# Patient Record
Sex: Male | Born: 1995 | Race: White | Hispanic: No | Marital: Single
Health system: Southern US, Community
[De-identification: ages and names within clinical notes are randomized; demographics above are authoritative.]

---
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
## 2021-04-13 ENCOUNTER — Telehealth: Payer: Self-pay | Admitting: *Deleted

## 2021-04-13 ENCOUNTER — Encounter: Payer: Self-pay | Admitting: *Deleted

## 2021-04-13 NOTE — Telephone Encounter (Signed)
Pt's partner advised that pt called out of work today as he had a sore throat, same as partner. Spoke with pt by phone. He reports calling out out of concern for partner having strep throat and didn't want to change being contagious. Reports his sx are mild compared to hers. They are both heading to urgent care now. Will plan to f/u by phone prior to clinic closing today to check on both of them and try to develop a plan for return. For now, will plan on both being out of work tomorrow pending possible strep vs covid testing.

## 2021-04-13 NOTE — Telephone Encounter (Signed)
Reviewed RN Rolly Salter note; reviewed epic no labs/encounter notes visible for today.  Agree with plan of care will follow up with patient tomorrow via telephone.

## 2021-04-14 NOTE — Telephone Encounter (Signed)
Reviewed RN Rolly Salter note agreed with plan of care waiting on partner covid test results patient to isolate until results known.

## 2021-04-14 NOTE — Telephone Encounter (Signed)
Spoke with pt by phone. He is at work currently. He reported his sore throat has improved significantly and thought it was related to being outside a lot this weekend and seasonal allergies since it improved. He was not seen at [facility] yesterday, only took partner there. He is vaxxed and boosted but home is a one bedroom and unable to quarantine separately so RN sent him home today from work at least until partner's results are back, at least through tomorrow 4/20. Pt agreeable with this and left work then.

## 2021-04-16 NOTE — Telephone Encounter (Signed)
Case discussed with RN Rolly Salter in clinic today; reviewed note agreed with plan of care

## 2021-04-16 NOTE — Telephone Encounter (Signed)
Spoke with pt by phone. He reports feeling well. Asymptomatic. Partner's covid test negative but throat culture still pending so cleared pt to RTW tomorrow 4/22 but with mask use until final result is back. Pt verbalizes understanding and agreement. Denies questions/concerns.

## 2021-04-17 NOTE — Telephone Encounter (Signed)
Called pt to confirm RTW today as planned. No answer. LVMRCB.

## 2021-04-17 NOTE — Telephone Encounter (Signed)
Pt's supervisor confirmed he did RTW today as expected. Closing encounter.

## 2021-04-19 NOTE — Telephone Encounter (Signed)
Reviewed RN Rolly Salter note agreed with plan of care; partner covid test negative and RTW.

## 2021-09-15 ENCOUNTER — Ambulatory Visit: Payer: Self-pay | Admitting: *Deleted

## 2021-09-15 ENCOUNTER — Other Ambulatory Visit: Payer: Self-pay

## 2021-09-15 VITALS — BP 112/82 | HR 100 | Ht 76.0 in | Wt 191.0 lb

## 2021-09-15 DIAGNOSIS — Z0000 Encounter for general adult medical examination without abnormal findings: Secondary | ICD-10-CM

## 2021-09-15 DIAGNOSIS — R7303 Prediabetes: Secondary | ICD-10-CM

## 2021-09-15 DIAGNOSIS — D696 Thrombocytopenia, unspecified: Secondary | ICD-10-CM

## 2021-09-15 NOTE — Progress Notes (Signed)
[employer] insurance premium discount evaluation: Labs Drawn. Replacements ROI form signed. Tobacco Free Attestation form signed.  Forms placed in paper chart.  

## 2021-09-16 LAB — CMP12+LP+TP+TSH+6AC+CBC/D/PLT
ALT: 21 IU/L (ref 0–44)
AST: 17 IU/L (ref 0–40)
Albumin/Globulin Ratio: 2.4 — ABNORMAL HIGH (ref 1.2–2.2)
Albumin: 5 g/dL (ref 4.1–5.2)
Alkaline Phosphatase: 83 IU/L (ref 44–121)
BUN/Creatinine Ratio: 16 (ref 9–20)
BUN: 16 mg/dL (ref 6–20)
Basophils Absolute: 0.1 10*3/uL (ref 0.0–0.2)
Basos: 1 %
Bilirubin Total: 0.3 mg/dL (ref 0.0–1.2)
Calcium: 9.7 mg/dL (ref 8.7–10.2)
Chloride: 105 mmol/L (ref 96–106)
Chol/HDL Ratio: 3.3 ratio (ref 0.0–5.0)
Cholesterol, Total: 163 mg/dL (ref 100–199)
Creatinine, Ser: 1.03 mg/dL (ref 0.76–1.27)
EOS (ABSOLUTE): 0.1 10*3/uL (ref 0.0–0.4)
Eos: 1 %
Estimated CHD Risk: <0.5 times avg. (ref 0.0–1.0)
Free Thyroxine Index: 1.9 (ref 1.2–4.9)
GGT: 17 IU/L (ref 0–65)
Globulin, Total: 2.1 g/dL (ref 1.5–4.5)
Glucose: 104 mg/dL — ABNORMAL HIGH (ref 65–99)
HDL: 50 mg/dL (ref >39)
Hematocrit: 47.4 % (ref 37.5–51.0)
Hemoglobin: 16.6 g/dL (ref 13.0–17.7)
Immature Grans (Abs): 0.1 10*3/uL (ref 0.0–0.1)
Immature Granulocytes: 1 %
Iron: 76 ug/dL (ref 38–169)
LDH: 169 IU/L (ref 121–224)
LDL Chol Calc (NIH): 99 mg/dL (ref 0–99)
Lymphocytes Absolute: 2.4 10*3/uL (ref 0.7–3.1)
Lymphs: 33 %
MCH: 29.9 pg (ref 26.6–33.0)
MCHC: 35 g/dL (ref 31.5–35.7)
MCV: 85 fL (ref 79–97)
Monocytes Absolute: 0.4 10*3/uL (ref 0.1–0.9)
Monocytes: 5 %
Neutrophils Absolute: 4.3 10*3/uL (ref 1.4–7.0)
Neutrophils: 59 %
Phosphorus: 3.8 mg/dL (ref 2.8–4.1)
Platelets: 149 10*3/uL — ABNORMAL LOW (ref 150–450)
Potassium: 4.4 mmol/L (ref 3.5–5.2)
RBC: 5.56 x10E6/uL (ref 4.14–5.80)
RDW: 11.7 % (ref 11.6–15.4)
Sodium: 140 mmol/L (ref 134–144)
T3 Uptake Ratio: 28 % (ref 24–39)
T4, Total: 6.9 ug/dL (ref 4.5–12.0)
TSH: 1.51 u[IU]/mL (ref 0.450–4.500)
Total Protein: 7.1 g/dL (ref 6.0–8.5)
Triglycerides: 73 mg/dL (ref 0–149)
Uric Acid: 6.3 mg/dL (ref 3.8–8.4)
VLDL Cholesterol Cal: 14 mg/dL (ref 5–40)
WBC: 7.3 10*3/uL (ref 3.4–10.8)
eGFR: 103 mL/min/{1.73_m2} (ref >59)

## 2021-09-16 LAB — HGB A1C W/O EAG: Hgb A1c MFr Bld: 5.7 % — ABNORMAL HIGH (ref 4.8–5.6)

## 2021-09-16 NOTE — Addendum Note (Signed)
Addended by: Albina Billet A on: 09/16/2021 09:11 AM   Modules accepted: Orders

## 2022-02-11 ENCOUNTER — Telehealth: Payer: Self-pay | Admitting: Registered Nurse

## 2022-02-11 DIAGNOSIS — D696 Thrombocytopenia, unspecified: Secondary | ICD-10-CM

## 2022-02-11 DIAGNOSIS — R7303 Prediabetes: Secondary | ICD-10-CM

## 2022-04-24 NOTE — Telephone Encounter (Signed)
Patient contacted via telephone and reminded follow up Hgba1c and CBC for low platelets recommended.  Patient stated he has followed up with provider and had no further questions or concerns at this time. ?
